# Patient Record
Sex: Male | Born: 1950 | Race: Black or African American | Hispanic: No | Marital: Married | Smoking: Never smoker
Health system: Southern US, Community
[De-identification: ages and names within clinical notes are randomized; demographics above are authoritative.]

## PROBLEM LIST (undated history)

## (undated) DIAGNOSIS — R569 Unspecified convulsions: Secondary | ICD-10-CM

## (undated) DIAGNOSIS — I10 Essential (primary) hypertension: Secondary | ICD-10-CM

## (undated) DIAGNOSIS — C3490 Malignant neoplasm of unspecified part of unspecified bronchus or lung: Secondary | ICD-10-CM

## (undated) HISTORY — PX: LUNG SURGERY: SHX703

---

## 2008-03-30 ENCOUNTER — Emergency Department (HOSPITAL_BASED_OUTPATIENT_CLINIC_OR_DEPARTMENT_OTHER): Admission: EM | Admit: 2008-03-30 | Discharge: 2008-03-30 | Payer: Self-pay | Admitting: Emergency Medicine

## 2008-04-02 ENCOUNTER — Emergency Department (HOSPITAL_BASED_OUTPATIENT_CLINIC_OR_DEPARTMENT_OTHER): Admission: EM | Admit: 2008-04-02 | Discharge: 2008-04-02 | Payer: Self-pay | Admitting: Emergency Medicine

## 2008-06-24 ENCOUNTER — Emergency Department (HOSPITAL_BASED_OUTPATIENT_CLINIC_OR_DEPARTMENT_OTHER): Admission: EM | Admit: 2008-06-24 | Discharge: 2008-06-24 | Payer: Self-pay | Admitting: Emergency Medicine

## 2008-06-26 ENCOUNTER — Emergency Department (HOSPITAL_BASED_OUTPATIENT_CLINIC_OR_DEPARTMENT_OTHER): Admission: EM | Admit: 2008-06-26 | Discharge: 2008-06-26 | Payer: Self-pay | Admitting: Emergency Medicine

## 2008-07-18 ENCOUNTER — Emergency Department (HOSPITAL_BASED_OUTPATIENT_CLINIC_OR_DEPARTMENT_OTHER): Admission: EM | Admit: 2008-07-18 | Discharge: 2008-07-18 | Payer: Self-pay | Admitting: Emergency Medicine

## 2009-03-13 ENCOUNTER — Emergency Department (HOSPITAL_BASED_OUTPATIENT_CLINIC_OR_DEPARTMENT_OTHER): Admission: EM | Admit: 2009-03-13 | Discharge: 2009-03-13 | Payer: Self-pay | Admitting: Emergency Medicine

## 2009-03-20 ENCOUNTER — Emergency Department (HOSPITAL_BASED_OUTPATIENT_CLINIC_OR_DEPARTMENT_OTHER): Admission: EM | Admit: 2009-03-20 | Discharge: 2009-03-20 | Payer: Self-pay | Admitting: Emergency Medicine

## 2009-07-03 ENCOUNTER — Emergency Department (HOSPITAL_BASED_OUTPATIENT_CLINIC_OR_DEPARTMENT_OTHER): Admission: EM | Admit: 2009-07-03 | Discharge: 2009-07-04 | Payer: Self-pay | Admitting: Emergency Medicine

## 2009-12-11 ENCOUNTER — Emergency Department (HOSPITAL_BASED_OUTPATIENT_CLINIC_OR_DEPARTMENT_OTHER): Admission: EM | Admit: 2009-12-11 | Discharge: 2009-12-12 | Payer: Self-pay | Admitting: Emergency Medicine

## 2010-05-31 ENCOUNTER — Emergency Department (HOSPITAL_BASED_OUTPATIENT_CLINIC_OR_DEPARTMENT_OTHER): Admission: EM | Admit: 2010-05-31 | Discharge: 2010-05-31 | Payer: Self-pay | Admitting: Emergency Medicine

## 2010-05-31 ENCOUNTER — Ambulatory Visit: Payer: Self-pay | Admitting: Diagnostic Radiology

## 2010-11-17 LAB — CBC
HCT: 37 % — ABNORMAL LOW (ref 39.0–52.0)
Platelets: 194 10*3/uL (ref 150–400)
WBC: 3.3 10*3/uL — ABNORMAL LOW (ref 4.0–10.5)

## 2010-11-17 LAB — BASIC METABOLIC PANEL
Creatinine, Ser: 1.2 mg/dL (ref 0.4–1.5)
Glucose, Bld: 106 mg/dL — ABNORMAL HIGH (ref 70–99)

## 2010-11-17 LAB — POCT CARDIAC MARKERS
CKMB, poc: <1 ng/mL — ABNORMAL LOW (ref 1.0–8.0)
Troponin i, poc: <0.05 ng/mL (ref 0.00–0.09)

## 2010-11-17 LAB — GLUCOSE, CAPILLARY: Glucose-Capillary: 104 mg/dL — ABNORMAL HIGH (ref 70–99)

## 2010-11-17 LAB — DIFFERENTIAL
Basophils Absolute: 0.1 10*3/uL (ref 0.0–0.1)
Basophils Relative: 3 % — ABNORMAL HIGH (ref 0–1)
Eosinophils Absolute: 0.1 10*3/uL (ref 0.0–0.7)
Eosinophils Relative: 3 % (ref 0–5)
Monocytes Absolute: 0.4 10*3/uL (ref 0.1–1.0)
Neutrophils Relative %: 51 % (ref 43–77)

## 2010-11-18 ENCOUNTER — Emergency Department (HOSPITAL_BASED_OUTPATIENT_CLINIC_OR_DEPARTMENT_OTHER)
Admission: EM | Admit: 2010-11-18 | Discharge: 2010-11-18 | Disposition: A | Discharge status: Home / Self Care | Payer: Medicare Other | Attending: Emergency Medicine | Admitting: Emergency Medicine

## 2010-11-18 ENCOUNTER — Emergency Department (INDEPENDENT_AMBULATORY_CARE_PROVIDER_SITE_OTHER): Payer: Medicare Other

## 2010-11-18 DIAGNOSIS — I10 Essential (primary) hypertension: Secondary | ICD-10-CM | POA: Insufficient documentation

## 2010-11-18 DIAGNOSIS — S8010XA Contusion of unspecified lower leg, initial encounter: Secondary | ICD-10-CM

## 2010-11-18 DIAGNOSIS — IMO0002 Reserved for concepts with insufficient information to code with codable children: Secondary | ICD-10-CM | POA: Insufficient documentation

## 2010-11-18 DIAGNOSIS — S9780XA Crushing injury of unspecified foot, initial encounter: Secondary | ICD-10-CM

## 2010-11-18 DIAGNOSIS — E7800 Pure hypercholesterolemia, unspecified: Secondary | ICD-10-CM | POA: Insufficient documentation

## 2010-11-23 LAB — CULTURE, ROUTINE-ABSCESS

## 2011-01-16 ENCOUNTER — Emergency Department (INDEPENDENT_AMBULATORY_CARE_PROVIDER_SITE_OTHER): Payer: Medicare Other

## 2011-01-16 ENCOUNTER — Emergency Department (HOSPITAL_BASED_OUTPATIENT_CLINIC_OR_DEPARTMENT_OTHER)
Admission: EM | Admit: 2011-01-16 | Discharge: 2011-01-16 | Disposition: A | Discharge status: Other Acute Inpatient Hospital | Payer: Medicare Other | Attending: Emergency Medicine | Admitting: Emergency Medicine

## 2011-01-16 DIAGNOSIS — R079 Chest pain, unspecified: Secondary | ICD-10-CM

## 2011-01-16 DIAGNOSIS — I10 Essential (primary) hypertension: Secondary | ICD-10-CM | POA: Insufficient documentation

## 2011-01-16 DIAGNOSIS — E7800 Pure hypercholesterolemia, unspecified: Secondary | ICD-10-CM | POA: Insufficient documentation

## 2011-01-16 DIAGNOSIS — Z79899 Other long term (current) drug therapy: Secondary | ICD-10-CM | POA: Insufficient documentation

## 2011-01-16 LAB — DIFFERENTIAL
Basophils Absolute: 0 10*3/uL (ref 0.0–0.1)
Basophils Relative: 1 % (ref 0–1)
Eosinophils Relative: 3 % (ref 0–5)
Lymphs Abs: 1 10*3/uL (ref 0.7–4.0)
Monocytes Absolute: 0.3 10*3/uL (ref 0.1–1.0)
Monocytes Relative: 10 % (ref 3–12)
Neutro Abs: 1.5 10*3/uL — ABNORMAL LOW (ref 1.7–7.7)
Neutrophils Relative %: 51 % (ref 43–77)

## 2011-01-16 LAB — BASIC METABOLIC PANEL
BUN: 19 mg/dL (ref 6–23)
CO2: 29 mEq/L (ref 19–32)
Calcium: 9.3 mg/dL (ref 8.4–10.5)
Chloride: 101 mEq/L (ref 96–112)
Creatinine, Ser: 1.1 mg/dL (ref 0.4–1.5)
GFR calc Af Amer: >60 mL/min (ref >60)
GFR calc non Af Amer: >60 mL/min (ref >60)
Glucose, Bld: 97 mg/dL (ref 70–99)
Potassium: 3.8 mEq/L (ref 3.5–5.1)
Sodium: 139 mEq/L (ref 135–145)

## 2011-01-16 LAB — CBC
HCT: 36.8 % — ABNORMAL LOW (ref 39.0–52.0)
Hemoglobin: 12.1 g/dL — ABNORMAL LOW (ref 13.0–17.0)
MCV: 86 fL (ref 78.0–100.0)
WBC: 2.9 10*3/uL — ABNORMAL LOW (ref 4.0–10.5)

## 2011-01-16 LAB — D-DIMER, QUANTITATIVE: D-Dimer, Quant: <0.22 ug/mL-FEU (ref 0.00–0.48)

## 2011-01-16 LAB — TROPONIN I: Troponin I: <0.3 ng/mL (ref <0.30)

## 2011-01-16 LAB — CK TOTAL AND CKMB (NOT AT ARMC): Relative Index: 1.7 (ref 0.0–2.5)

## 2011-06-06 LAB — CBC
HCT: 39
Hemoglobin: 13.2
MCHC: 33.9
MCV: 88.8
Platelets: 172
RBC: 4.39
RDW: 12.8
WBC: 4.4

## 2011-06-06 LAB — BASIC METABOLIC PANEL
CO2: 31
Chloride: 92 — ABNORMAL LOW
Creatinine, Ser: 1.2
GFR calc non Af Amer: >60
Sodium: 132 — ABNORMAL LOW

## 2011-06-06 LAB — DIFFERENTIAL
Basophils Absolute: 0.1
Basophils Relative: 3 — ABNORMAL HIGH
Eosinophils Absolute: 0
Eosinophils Relative: 1
Lymphocytes Relative: 20
Lymphs Abs: 0.9
Monocytes Absolute: 0.7
Monocytes Relative: 17 — ABNORMAL HIGH
Neutro Abs: 2.7
Neutrophils Relative %: 60

## 2011-06-06 LAB — BASIC METABOLIC PANEL WITH GFR
BUN: 17
Calcium: 8.5
GFR calc Af Amer: >60
Glucose, Bld: 87
Potassium: 3.9

## 2011-06-06 LAB — POCT CARDIAC MARKERS
CKMB, poc: 1.9
Myoglobin, poc: 119
Troponin i, poc: <0.05

## 2015-06-27 ENCOUNTER — Emergency Department (HOSPITAL_BASED_OUTPATIENT_CLINIC_OR_DEPARTMENT_OTHER): Payer: Medicare Other

## 2015-06-27 ENCOUNTER — Encounter (HOSPITAL_BASED_OUTPATIENT_CLINIC_OR_DEPARTMENT_OTHER): Payer: Self-pay | Admitting: *Deleted

## 2015-06-27 ENCOUNTER — Emergency Department (HOSPITAL_BASED_OUTPATIENT_CLINIC_OR_DEPARTMENT_OTHER)
Admission: EM | Admit: 2015-06-27 | Discharge: 2015-06-27 | Disposition: A | Discharge status: Home / Self Care | Payer: Medicare Other | Attending: Emergency Medicine | Admitting: Emergency Medicine

## 2015-06-27 DIAGNOSIS — Z8669 Personal history of other diseases of the nervous system and sense organs: Secondary | ICD-10-CM | POA: Insufficient documentation

## 2015-06-27 DIAGNOSIS — J069 Acute upper respiratory infection, unspecified: Secondary | ICD-10-CM | POA: Diagnosis not present

## 2015-06-27 DIAGNOSIS — I10 Essential (primary) hypertension: Secondary | ICD-10-CM | POA: Insufficient documentation

## 2015-06-27 DIAGNOSIS — Z85118 Personal history of other malignant neoplasm of bronchus and lung: Secondary | ICD-10-CM | POA: Diagnosis not present

## 2015-06-27 DIAGNOSIS — R05 Cough: Secondary | ICD-10-CM | POA: Diagnosis present

## 2015-06-27 DIAGNOSIS — R059 Cough, unspecified: Secondary | ICD-10-CM

## 2015-06-27 HISTORY — DX: Essential (primary) hypertension: I10

## 2015-06-27 HISTORY — DX: Malignant neoplasm of unspecified part of unspecified bronchus or lung: C34.90

## 2015-06-27 HISTORY — DX: Unspecified convulsions: R56.9

## 2015-06-27 MED ORDER — FLUTICASONE PROPIONATE 50 MCG/ACT NA SUSP: 2.0000 | Freq: Every day | NASAL | Status: DC

## 2015-06-27 MED ORDER — DM-GUAIFENESIN ER 30-600 MG PO TB12: 1.0000 | ORAL_TABLET | Freq: Two times a day (BID) | ORAL | Status: DC

## 2015-06-27 MED ORDER — BENZONATATE 100 MG PO CAPS: 100.0000 mg | ORAL_CAPSULE | Freq: Three times a day (TID) | ORAL | Status: DC

## 2015-06-27 NOTE — ED Notes (Signed)
States cough productive at times , thin sec

## 2015-06-27 NOTE — ED Notes (Signed)
Cough x 2-3 days.  Denies fever.

## 2015-06-27 NOTE — ED Provider Notes (Signed)
CSN: [id number]     Arrival date & time 06/27/15  1651 History   First MD Initiated Contact with Patient 06/27/15 1738     Chief Complaint  Patient presents with  . Cough    Patient is a 64 y.o. male presenting with cough.  Cough    Bryan Gamble is a 64 y.o. male with h/o seizures, HTN, and lung cancer s/p right hemilobe resection who presents to the ED for evaluation of cough. He states he was in his usual state of health until ~2-3 days ago when he started having a cough. States the cough is sometimes dry sometimes productive. States his main concern is he wants something for his cough because it is keeping him up at night. Denies fever, chills, nasal congestion, N/V/D, chest pain, SOB. Denies hemoptysis. Denies diaphoresis. Denies sick contacts. Denies tobacco, EtOH, or other drug use.   Past Medical History  Diagnosis Date  . Hypertension   . Seizures ([place])   . Lung cancer [facility])    Past Surgical History  Procedure Laterality Date  . Lung surgery     History reviewed. No pertinent family history. Social History  Substance Use Topics  . Smoking status: Never Smoker   . Smokeless tobacco: None  . Alcohol Use: No    Review of Systems  Respiratory: Positive for cough.   All other systems reviewed and are negative.     Allergies  Review of patient's allergies indicates no known allergies.  Home Medications   Prior to Admission medications   Medication Sig Start Date End Date Taking? Authorizing Provider  UNKNOWN TO PATIENT    Yes Historical Provider, MD  benzonatate (TESSALON) 100 MG capsule Take 1 capsule (100 mg total) by mouth every 8 (eight) hours. 06/27/15   Bryan Canter Julius Matus, PA-C  dextromethorphan-guaiFENesin (MUCINEX DM) 30-600 MG 12hr tablet Take 1 tablet by mouth 2 (two) times daily. 06/27/15   Bryan Canter Amaya Blakeman, PA-C  fluticasone (FLONASE) 50 MCG/ACT nasal spray Place 2 sprays into both nostrils daily. 06/27/15   Bryan Canter Jalen Daluz, PA-C   BP 137/76 mmHg  Pulse 78   Temp(Src) 98.1 F (36.7 C) (Oral)  Resp 16  Ht '5\' 11"'$  (1.803 m)  Wt 260 lb (117.935 kg)  BMI 36.28 kg/m2  SpO2 97% Physical Exam  Constitutional: He is oriented to person, place, and time. He appears well-developed and well-nourished.  HENT:  Right Ear: External ear normal.  Left Ear: External ear normal.  Mouth/Throat: Oropharynx is clear and moist. No oropharyngeal exudate.  Eyes: No scleral icterus.  Neck: Normal range of motion. Neck supple.  Cardiovascular: Normal rate, regular rhythm and normal heart sounds.   Pulmonary/Chest: Effort normal and breath sounds normal. No respiratory distress. He has no wheezes. He has no rales.  Abdominal: Soft. Bowel sounds are normal. He exhibits no distension. There is no tenderness.  Lymphadenopathy:    He has no cervical adenopathy.  Neurological: He is alert and oriented to person, place, and time.  Nursing note and vitals reviewed.   ED Course  Procedures (including critical care time) Labs Review Labs Reviewed - No data to display  Imaging Review Dg Chest 2 View  06/27/2015  CLINICAL DATA:  Cough, history of lung cancer EXAM: CHEST  2 VIEW COMPARISON:  03/18/2014 FINDINGS: Postsurgical changes related to prior right lung resection with volume loss in the right hemithorax. Left lung is clear.  No pleural effusion or pneumothorax. The heart is normal in size. Degenerative changes of  the visualized thoracolumbar spine. IMPRESSION: Postsurgical changes in the right hemithorax. No evidence of acute cardiopulmonary disease. Electronically Signed   By: Julian Hy M.D.   On: 06/27/2015 17:20   I have personally reviewed and evaluated these images and lab results as part of my medical decision-making.   EKG Interpretation None      MDM   Final diagnoses:  Cough  Upper respiratory infection    Patient clinically looks well. Vital signs unremarkable. Given the short duration of his cough and lack of other symptoms, I believe  this to be a URI, likely viral, and antibiotics at this time. CXR with no evidence of pneumonia. I do not believe any bloodwork indicated at this time as I have low suspicion for other infectious or malignant process. Will give rx for tessalon perles. Discussed supportive care. Return precautions given. Pt verbalized his understanding.     Anne Ng, PA-C 06/27/15 1840  Varney Biles, MD 06/29/15 0005

## 2015-06-27 NOTE — Discharge Instructions (Signed)
Take the cough medicine (Tessalon) as needed for cough. Mucinex will help loosen your mucus so it is easier to expel. The flonase (nasal spray) is for nasal congestion. If symptoms worsen or do not improve in one week, return to your primary care provider for follow-up. If you develop high fever, difficulty breathing, chest pain, or any other concerning symptoms, return to the emergency room immediately.  Please obtain all of your results from medical records or have your doctors office obtain the results - share them with your doctor - you should be seen at your doctors office in the next 2 days. Call today to arrange your follow up. Take the medications as prescribed. Please review all of the medicines and only take them if you do not have an allergy to them. Please be aware that if you are taking birth control pills, taking other prescriptions, ESPECIALLY ANTIBIOTICS may make the birth control ineffective - if this is the case, either do not engage in sexual activity or use alternative methods of birth control such as condoms until you have finished the medicine and your family doctor says it is OK to restart them. If you are on a blood thinner such as COUMADIN, be aware that any other medicine that you take may cause the coumadin to either work too much, or not enough - you should have your coumadin level rechecked in next 7 days if this is the case.  ?  It is also a possibility that you have an allergic reaction to any of the medicines that you have been prescribed - Everybody reacts differently to medications and while MOST people have no trouble with most medicines, you may have a reaction such as nausea, vomiting, rash, swelling, shortness of breath. If this is the case, please stop taking the medicine immediately and contact your physician.  ?  You should return to the ER if you develop severe or worsening symptoms.

## 2015-06-27 NOTE — ED Notes (Signed)
Presents with cough x 2 days, sounds congested

## 2015-06-27 NOTE — ED Notes (Signed)
Denies any N/V/D, appetite good

## 2016-09-09 ENCOUNTER — Emergency Department (HOSPITAL_BASED_OUTPATIENT_CLINIC_OR_DEPARTMENT_OTHER): Payer: Medicare Other

## 2016-09-09 ENCOUNTER — Emergency Department (HOSPITAL_BASED_OUTPATIENT_CLINIC_OR_DEPARTMENT_OTHER)
Admission: EM | Admit: 2016-09-09 | Discharge: 2016-09-09 | Disposition: A | Discharge status: Home / Self Care | Payer: Medicare Other | Attending: Emergency Medicine | Admitting: Emergency Medicine

## 2016-09-09 ENCOUNTER — Encounter (HOSPITAL_BASED_OUTPATIENT_CLINIC_OR_DEPARTMENT_OTHER): Payer: Self-pay | Admitting: Emergency Medicine

## 2016-09-09 DIAGNOSIS — R51 Headache: Secondary | ICD-10-CM | POA: Insufficient documentation

## 2016-09-09 DIAGNOSIS — Z79899 Other long term (current) drug therapy: Secondary | ICD-10-CM | POA: Insufficient documentation

## 2016-09-09 DIAGNOSIS — I10 Essential (primary) hypertension: Secondary | ICD-10-CM | POA: Diagnosis not present

## 2016-09-09 DIAGNOSIS — Z85118 Personal history of other malignant neoplasm of bronchus and lung: Secondary | ICD-10-CM | POA: Diagnosis not present

## 2016-09-09 DIAGNOSIS — R0981 Nasal congestion: Secondary | ICD-10-CM | POA: Diagnosis not present

## 2016-09-09 DIAGNOSIS — J3489 Other specified disorders of nose and nasal sinuses: Secondary | ICD-10-CM | POA: Insufficient documentation

## 2016-09-09 DIAGNOSIS — R05 Cough: Secondary | ICD-10-CM | POA: Diagnosis present

## 2016-09-09 DIAGNOSIS — R059 Cough, unspecified: Secondary | ICD-10-CM

## 2016-09-09 MED ORDER — BENZONATATE 100 MG PO CAPS: 100.0000 mg | ORAL_CAPSULE | Freq: Three times a day (TID) | ORAL | 0 refills | Status: AC

## 2016-09-09 NOTE — ED Triage Notes (Signed)
Pt reports cough for past 2-3 days.  His wife tested positive for flu on Jan 4th.

## 2016-09-09 NOTE — Discharge Instructions (Signed)
Please read and follow all provided instructions.  Your diagnoses today include:  1. Cough     Tests performed today include:  Chest x-ray - does not show pneumonia  Vital signs. See below for your results today.   Medications prescribed:   Tessalon Perles - cough suppressant medication  Take any prescribed medications only as directed.  Home care instructions:  Follow any educational materials contained in this packet.  BE VERY CAREFUL not to take multiple medicines containing Tylenol (also called acetaminophen). Doing so can lead to an overdose which can damage your liver and cause liver failure and possibly death.   Follow-up instructions: Please follow-up with your primary care provider in the next 3 days for further evaluation of your symptoms.   Return instructions:   Please return to the Emergency Department if you experience worsening symptoms.   Return with chest pain, worsening lower extremity swelling  Please return if you have any other emergent concerns.  Additional Information:  Your vital signs today were: BP 124/73 (BP Location: Left Arm)    Pulse 61    Temp 98.2 F (36.8 C) (Oral)    Resp 18    Ht '5\' 10"'$  (1.778 m)    Wt 120.2 kg    SpO2 98%    BMI 38.02 kg/m  If your blood pressure (BP) was elevated above 135/85 this visit, please have this repeated by your doctor within one month. --------------

## 2016-09-09 NOTE — ED Notes (Signed)
ED Provider at bedside. 

## 2016-09-09 NOTE — ED Notes (Signed)
Pt reports cough, congestion x 2-3 days. Productive, but has not noticed color. Head hurting with cough. Denies other s/s. Wife is also sick with similar s/s.

## 2016-09-09 NOTE — ED Provider Notes (Signed)
[facility] Provider Note   CSN: [id number] Arrival date & time: 09/09/16  1544  By signing my name below, I, Soijett Blue, attest that this documentation has been prepared under the direction and in the presence of Alecia Lemming, PA-C Electronically Signed: Soijett Blue, ED Scribe. 09/09/16. 6:06 PM.  History   Chief Complaint Chief Complaint  Patient presents with  . Cough    HPI Bryan Gamble is a 66 y.o. male with a PMHx of lung CA, HTN, who presents to the Emergency Department complaining of cough onset 2-3 days ago. Pt notes that his wife was dx with flu 2 days ago and he is in the ED for evaluation of his symptoms. He is having associated symptoms of HA, rhinorrhea, and nasal congestion. He has tried ibuprofen with no relief for his symptoms. He denies SOB, wheezing, fever, chills, vomiting, diarrhea, leg swelling, and any other symptoms. Denies PMHx of CHF or having a PCP at this time. States no worsening swelling in legs. He typically sleeps sitting up a bit, and this has not changed lately.    The history is provided by the patient. No language interpreter was used.    Past Medical History:  Diagnosis Date  . Hypertension   . Lung cancer ([place])   . Seizures ([place])     There are no active problems to display for this patient.   Past Surgical History:  Procedure Laterality Date  . LUNG SURGERY         Home Medications    Prior to Admission medications   Medication Sig Start Date End Date Taking? Authorizing Provider  levETIRAcetam (KEPPRA) 500 MG tablet Take 500 mg by mouth 2 (two) times daily.   Yes Historical Provider, MD  UNKNOWN TO PATIENT    Yes Historical Provider, MD  benzonatate (TESSALON) 100 MG capsule Take 1 capsule (100 mg total) by mouth every 8 (eight) hours. 06/27/15   Olivia Canter Sam, PA-C  dextromethorphan-guaiFENesin (MUCINEX DM) 30-600 MG 12hr tablet Take 1 tablet by mouth 2 (two) times daily. 06/27/15   Olivia Canter Sam, PA-C  fluticasone  (FLONASE) 50 MCG/ACT nasal spray Place 2 sprays into both nostrils daily. 06/27/15   Anne Ng, PA-C    Family History No family history on file.  Social History Social History  Substance Use Topics  . Smoking status: Never Smoker  . Smokeless tobacco: Never Used  . Alcohol use No     Allergies   Patient has no known allergies.   Review of Systems Review of Systems  Constitutional: Negative for chills and fever.  HENT: Positive for congestion and rhinorrhea. Negative for sore throat.   Eyes: Negative for redness.  Respiratory: Positive for cough. Negative for shortness of breath and wheezing.   Cardiovascular: Negative for chest pain and leg swelling.  Gastrointestinal: Negative for abdominal pain, diarrhea, nausea and vomiting.  Genitourinary: Negative for dysuria.  Musculoskeletal: Negative for myalgias.  Skin: Negative for rash.  Neurological: Positive for headaches.     Physical Exam Updated Vital Signs BP 124/73 (BP Location: Left Arm)   Pulse 61   Temp 98.2 F (36.8 C) (Oral)   Resp 18   Ht '5\' 10"'$  (1.778 m)   Wt 265 lb (120.2 kg)   SpO2 98%   BMI 38.02 kg/m   Physical Exam  Constitutional: He is oriented to person, place, and time. He appears well-developed and well-nourished. No distress.  HENT:  Head: Normocephalic and atraumatic.  Right Ear:  Tympanic membrane, external ear and ear canal normal.  Left Ear: Tympanic membrane, external ear and ear canal normal.  Nose: Nose normal.  Mouth/Throat: Uvula is midline, oropharynx is clear and moist and mucous membranes are normal.  Eyes: Conjunctivae, EOM and lids are normal. Pupils are equal, round, and reactive to light. Right eye exhibits no discharge. Left eye exhibits no discharge.  Neck: Normal range of motion. Neck supple. No JVD present.  Cardiovascular: Normal rate, regular rhythm and normal heart sounds.   Pulmonary/Chest: Effort normal and breath sounds normal. No respiratory distress. He has no  wheezes. He has no rales.  Abdominal: Soft. He exhibits no distension. There is no tenderness.  Musculoskeletal: Normal range of motion.       Cervical back: He exhibits normal range of motion, no tenderness and no bony tenderness.  Trace edema bilateral ankles  Neurological: He is alert and oriented to person, place, and time. He has normal strength and normal reflexes. No cranial nerve deficit or sensory deficit. He exhibits normal muscle tone. He displays a negative Romberg sign. Coordination and gait normal. GCS eye subscore is 4. GCS verbal subscore is 5. GCS motor subscore is 6.  Skin: Skin is warm and dry.  Psychiatric: He has a normal mood and affect. His behavior is normal.  Nursing note and vitals reviewed.    ED Treatments / Results  DIAGNOSTIC STUDIES: Oxygen Saturation is 98% on RA, nl by my interpretation.    COORDINATION OF CARE: 6:06 PM Discussed treatment plan with pt at bedside which includes CXR and pt agreed to plan.   Radiology Dg Chest 2 View  Result Date: 09/09/2016 CLINICAL DATA:  Cough, congestion x 3 days EXAM: CHEST  2 VIEW COMPARISON:  Radiograph 06/27/2015 FINDINGS: Normal cardiac silhouette. Chronic scarring at the RIGHT lateral lung base. Mild central venous pulmonary congestion. No infiltrate or pneumothorax. No pleural fluid. Degenerative osteophytosis of the spine. IMPRESSION: 1. Central venous pulmonary congestion. 2. Chronic scarring at the RIGHT lung base. Electronically Signed   By: Suzy Bouchard M.D.   On: 09/09/2016 17:19    Procedures Procedures (including critical care time)  Medications Ordered in ED Medications - No data to display   Initial Impression / Assessment and Plan / ED Course  I have reviewed the triage vital signs and the nursing notes.  Pertinent imaging results that were available during my care of the patient were reviewed by me and considered in my medical decision making (see chart for details).  Clinical Course     Patient seen and examined. X-ray reviewed.   Vital signs reviewed and are as follows: BP 124/73 (BP Location: Left Arm)   Pulse 61   Temp 98.2 F (36.8 C) (Oral)   Resp 18   Ht '5\' 10"'$  (1.778 m)   Wt 120.2 kg   SpO2 98%   BMI 38.02 kg/m   Will d/c with medication for cough. Encourage patient to follow-up with his primary care physician if he has worsening lower extremity swelling, difficulty breathing, difficulty lying flat or other concerns. Patient verbalizes understanding and agrees with plan.  Final Clinical Impressions(s) / ED Diagnoses   Final diagnoses:  Cough   Patient with cough. He has been exposed to his wife who has a similar respiratory infection. Patient does not have clinical signs of worsening CHF, although he does have some mild bilateral lower extremity edema and mild central vascular congestion on chest x-ray. I do not feel this is a congestive heart  failure exacerbation today. Lung sounds are clear on exam. No wheezing. No CP or signs of ACS. Will treat symptomatically. Vital signs are normal.  New Prescriptions New Prescriptions   BENZONATATE (TESSALON) 100 MG CAPSULE    Take 1 capsule (100 mg total) by mouth every 8 (eight) hours.   I personally performed the services described in this documentation, which was scribed in my presence. The recorded information has been reviewed and is accurate.     Carlisle Cater, PA-C 09/09/16 [place], MD 09/09/16 [phone]

## 2017-05-08 IMAGING — CR DG CHEST 2V
2 series · 2 of 2 positions shown · non-contrast
Comparison: Radiograph 06/27/2015

CLINICAL DATA: Cough, congestion x 3 days

EXAM:
CHEST  2 VIEW

[w chest pa]
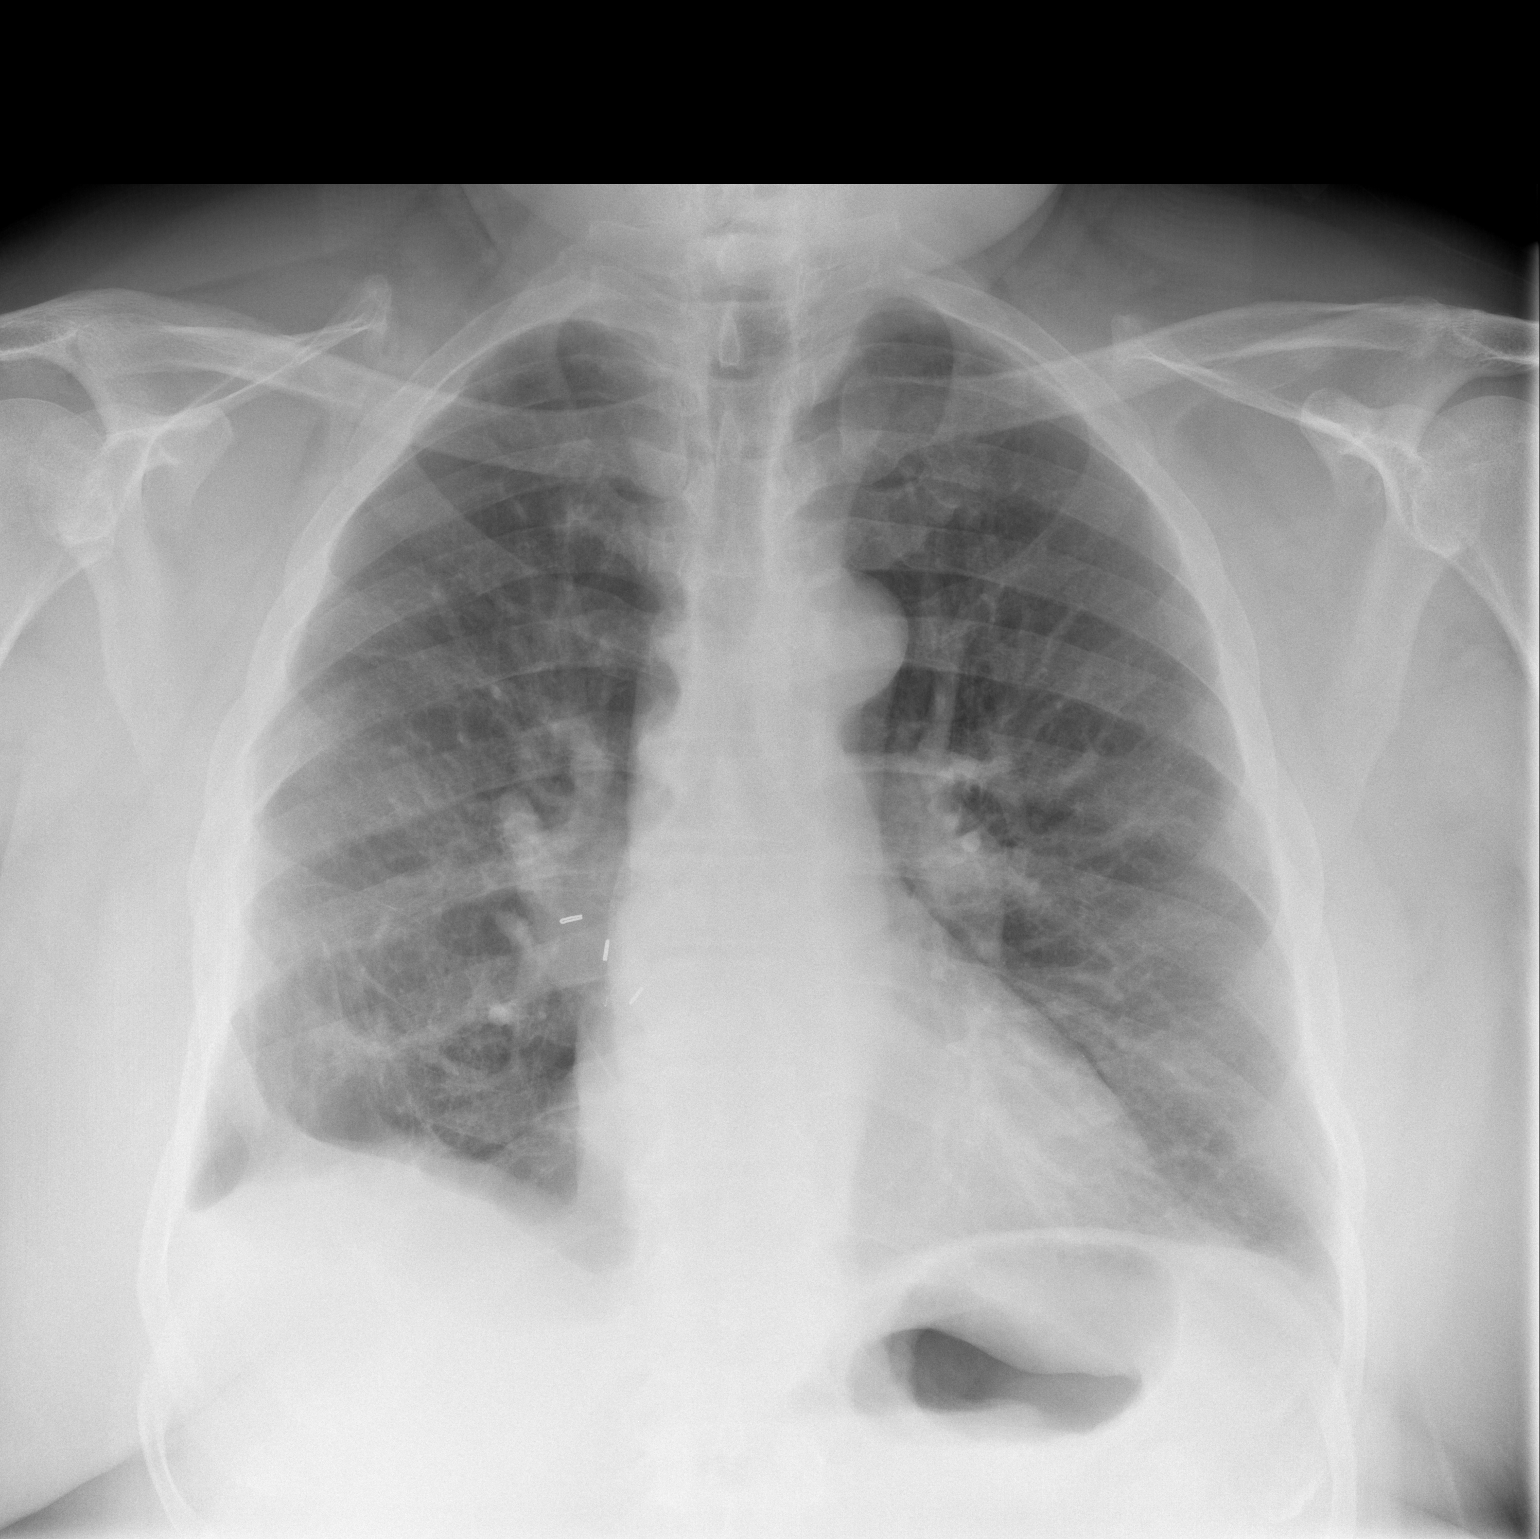

[w chest lat]
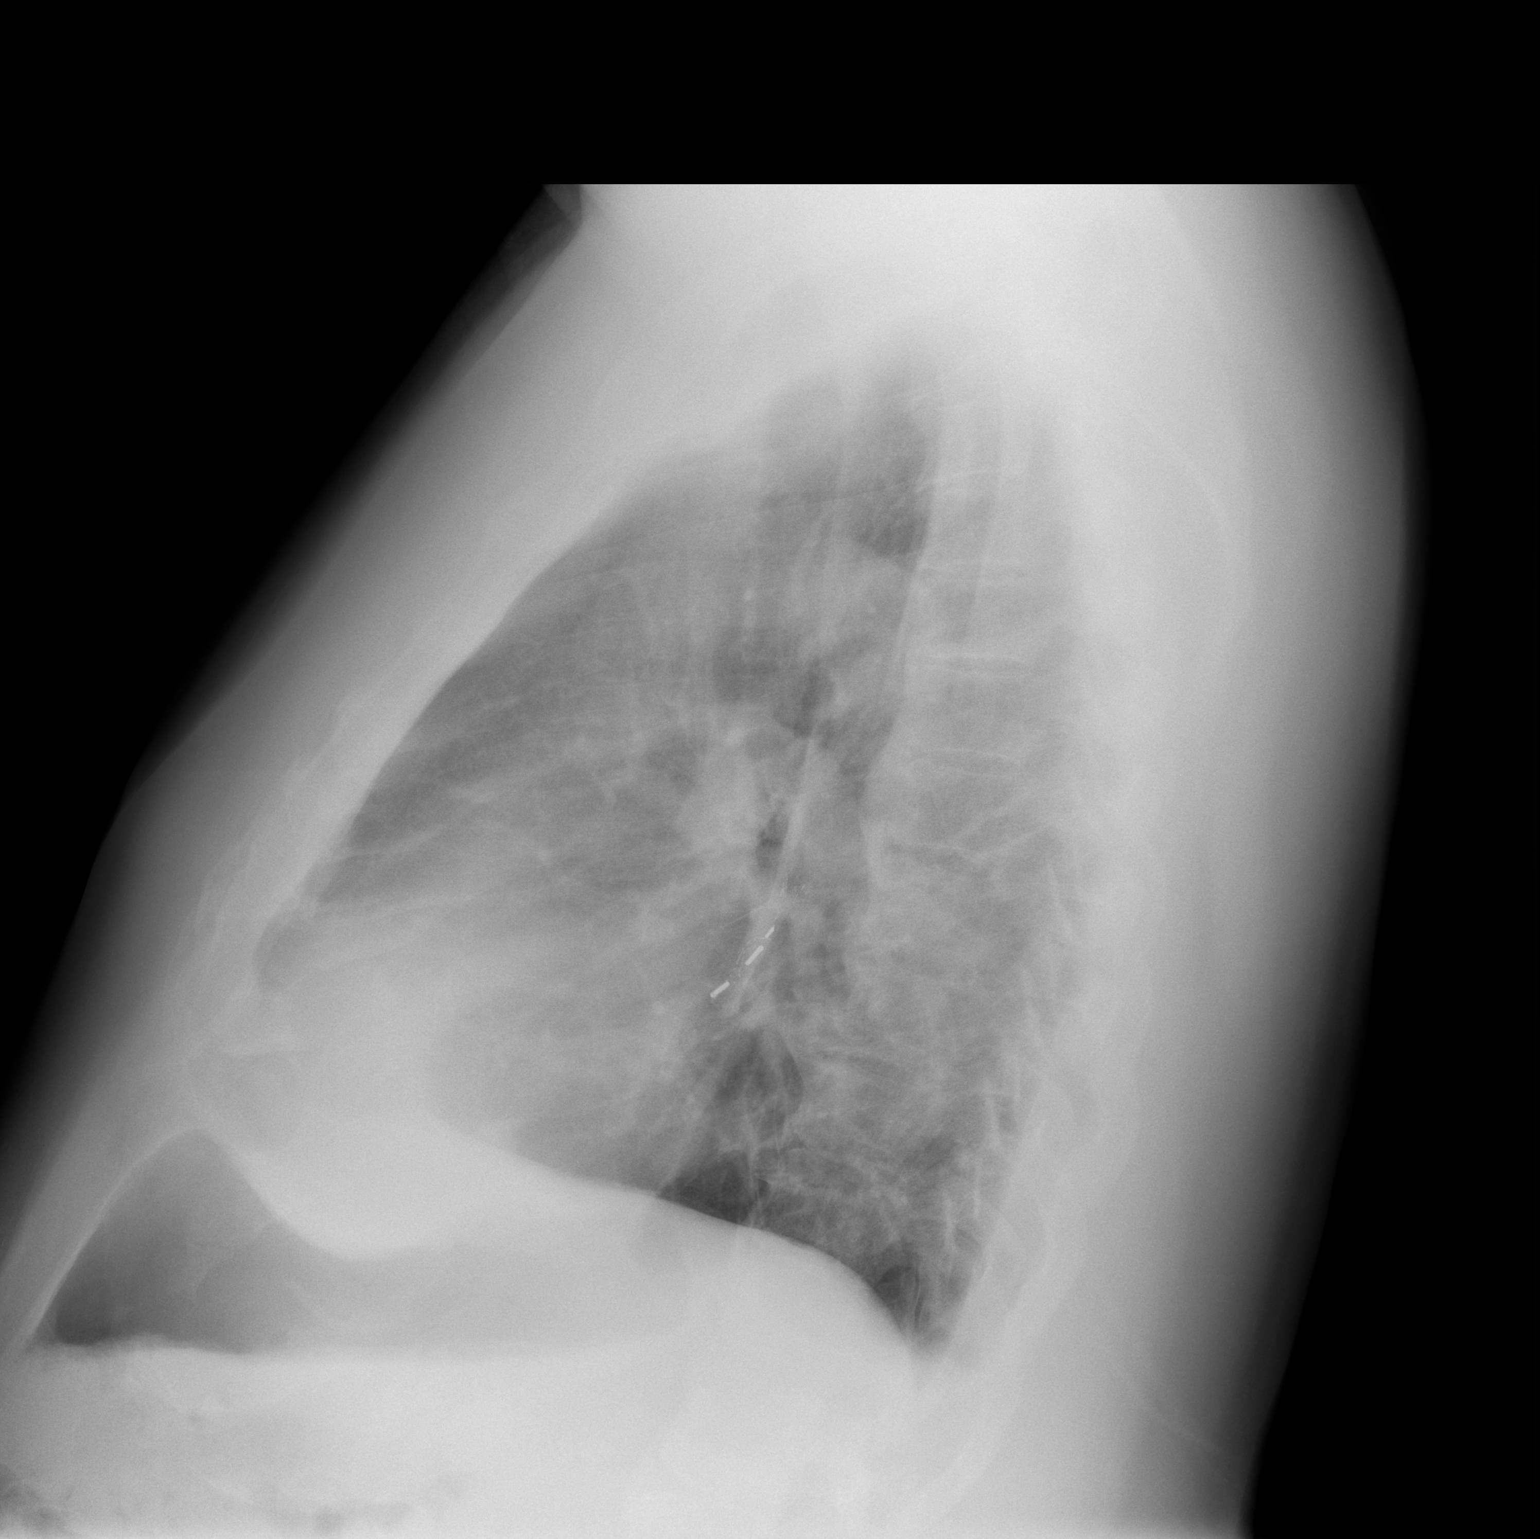

[2 of 2 positions shown; findings below may reference images not displayed]

FINDINGS: Normal cardiac silhouette. Chronic scarring at the RIGHT lateral
lung base. Mild central venous pulmonary congestion. No infiltrate
or pneumothorax. No pleural fluid. Degenerative osteophytosis of the
spine.
IMPRESSION: 1. Central venous pulmonary congestion.
2. Chronic scarring at the RIGHT lung base.

## 2019-03-18 ENCOUNTER — Other Ambulatory Visit: Payer: Self-pay | Admitting: *Deleted

## 2019-03-18 DIAGNOSIS — Z20822 Contact with and (suspected) exposure to covid-19: Secondary | ICD-10-CM

## 2019-03-23 LAB — NOVEL CORONAVIRUS, NAA: SARS-CoV-2, NAA: NOT DETECTED

## 2019-10-26 ENCOUNTER — Ambulatory Visit: Payer: Medicare Other | Attending: Internal Medicine

## 2019-10-26 DIAGNOSIS — Z23 Encounter for immunization: Secondary | ICD-10-CM | POA: Insufficient documentation

## 2019-10-26 NOTE — Progress Notes (Signed)
   Covid-19 Vaccination Clinic  Name:  JAISHAUN MCNAB    MRN: [id number] DOB: [birth year]  10/26/2019  Mr. Middlesworth was observed post Covid-19 immunization for 15 minutes without incidence. He was provided with Vaccine Information Sheet and instruction to access the V-Safe system.   Mr. Kegley was instructed to call 911 with any severe reactions post vaccine: Marland Kitchen Difficulty breathing  . Swelling of your face and throat  . A fast heartbeat  . A bad rash all over your body  . Dizziness and weakness    Immunizations Administered    Name Date Dose VIS Date Route   Pfizer COVID-19 Vaccine 10/26/2019  2:43 PM 0.3 mL 08/15/2019 Intramuscular   Manufacturer: [place]   Lot: [id number]   [place]: [address]-3

## 2019-11-19 ENCOUNTER — Ambulatory Visit: Payer: Medicare Other | Attending: Internal Medicine

## 2019-11-19 DIAGNOSIS — Z23 Encounter for immunization: Secondary | ICD-10-CM

## 2019-11-19 NOTE — Progress Notes (Signed)
   Covid-19 Vaccination Clinic  Name:  Bryan Gamble    MRN: [id number] DOB: 1951-01-01  11/19/2019  [name] was observed post Covid-19 immunization for 15 minutes without incident. He was provided with Vaccine Information Sheet and instruction to access the V-Safe system.   [name] was instructed to call 911 with any severe reactions post vaccine: Marland Kitchen Difficulty breathing  . Swelling of face and throat  . A fast heartbeat  . A bad rash all over body  . Dizziness and weakness   Immunizations Administered    Name Date Dose VIS Date Route   Pfizer COVID-19 Vaccine 11/19/2019  9:51 AM 0.3 mL 08/15/2019 Intramuscular   Manufacturer: [place]   Lot: [id number]   [place]: [address]-4
